# Patient Record
Sex: Male | Born: 1995
Health system: Southern US, Community
[De-identification: ages and names within clinical notes are randomized; demographics above are authoritative.]

## PROBLEM LIST (undated history)

## (undated) DIAGNOSIS — I10 Essential (primary) hypertension: Secondary | ICD-10-CM

---
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
## 2014-01-18 ENCOUNTER — Emergency Department (HOSPITAL_COMMUNITY): Payer: Self-pay

## 2014-01-18 ENCOUNTER — Encounter (HOSPITAL_COMMUNITY): Payer: Self-pay | Admitting: Emergency Medicine

## 2014-01-18 ENCOUNTER — Emergency Department (HOSPITAL_COMMUNITY)
Admission: EM | Admit: 2014-01-18 | Discharge: 2014-01-18 | Disposition: A | Discharge status: Home / Self Care | Payer: Self-pay | Attending: Emergency Medicine | Admitting: Emergency Medicine

## 2014-01-18 DIAGNOSIS — S20211A Contusion of right front wall of thorax, initial encounter: Secondary | ICD-10-CM

## 2014-01-18 DIAGNOSIS — S0003XA Contusion of scalp, initial encounter: Secondary | ICD-10-CM | POA: Insufficient documentation

## 2014-01-18 DIAGNOSIS — Z880 Allergy status to penicillin: Secondary | ICD-10-CM | POA: Insufficient documentation

## 2014-01-18 DIAGNOSIS — Y92410 Unspecified street and highway as the place of occurrence of the external cause: Secondary | ICD-10-CM | POA: Insufficient documentation

## 2014-01-18 DIAGNOSIS — S0990XA Unspecified injury of head, initial encounter: Secondary | ICD-10-CM | POA: Insufficient documentation

## 2014-01-18 DIAGNOSIS — S20219A Contusion of unspecified front wall of thorax, initial encounter: Secondary | ICD-10-CM | POA: Insufficient documentation

## 2014-01-18 DIAGNOSIS — T148XXA Other injury of unspecified body region, initial encounter: Secondary | ICD-10-CM

## 2014-01-18 DIAGNOSIS — S1093XA Contusion of unspecified part of neck, initial encounter: Principal | ICD-10-CM

## 2014-01-18 DIAGNOSIS — S0093XA Contusion of unspecified part of head, initial encounter: Secondary | ICD-10-CM

## 2014-01-18 DIAGNOSIS — S0083XA Contusion of other part of head, initial encounter: Principal | ICD-10-CM | POA: Insufficient documentation

## 2014-01-18 DIAGNOSIS — Y9389 Activity, other specified: Secondary | ICD-10-CM | POA: Insufficient documentation

## 2014-01-18 DIAGNOSIS — S40012A Contusion of left shoulder, initial encounter: Secondary | ICD-10-CM

## 2014-01-18 MED ORDER — METHOCARBAMOL 500 MG PO TABS
1000.0000 mg | ORAL_TABLET | Freq: Once | ORAL | Status: AC
  Administered 2014-01-18: 1000 mg via ORAL
  Filled 2014-01-18: qty 2

## 2014-01-18 MED ORDER — NAPROXEN 375 MG PO TABS: 375.0000 mg | ORAL_TABLET | Freq: Two times a day (BID) | ORAL | Status: DC

## 2014-01-18 MED ORDER — CYCLOBENZAPRINE HCL 5 MG PO TABS: 5.0000 mg | ORAL_TABLET | Freq: Three times a day (TID) | ORAL | Status: DC | PRN

## 2014-01-18 MED ORDER — IBUPROFEN 800 MG PO TABS
800.0000 mg | ORAL_TABLET | Freq: Once | ORAL | Status: AC
  Administered 2014-01-18: 800 mg via ORAL
  Filled 2014-01-18: qty 1
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
## 2014-01-18 NOTE — Discharge Instructions (Signed)
Ice packs to the injured or sore muscles and use heat to help the muscles relax.  Take the medications for pain and muscle soreness. Return to the ED for any problems listed on the head injury sheet. Recheck if you aren't improving in the next week.

## 2014-01-18 NOTE — ED Notes (Signed)
Back board removed with charge nurse.

## 2017-06-04 ENCOUNTER — Encounter (HOSPITAL_COMMUNITY): Payer: Self-pay | Admitting: *Deleted

## 2017-06-04 ENCOUNTER — Ambulatory Visit (HOSPITAL_COMMUNITY): Admission: RE | Admit: 2017-06-04 | Payer: Self-pay | Source: Ambulatory Visit

## 2017-06-04 ENCOUNTER — Emergency Department (HOSPITAL_COMMUNITY)
Admission: EM | Admit: 2017-06-04 | Discharge: 2017-06-04 | Discharge status: Against Medical Advice | Payer: Self-pay | Attending: Emergency Medicine | Admitting: Emergency Medicine

## 2017-06-04 ENCOUNTER — Other Ambulatory Visit: Payer: Self-pay

## 2017-06-04 ENCOUNTER — Ambulatory Visit (HOSPITAL_COMMUNITY): Payer: Self-pay

## 2017-06-04 ENCOUNTER — Emergency Department (HOSPITAL_COMMUNITY): Admission: RE | Admit: 2017-06-04 | Payer: Self-pay | Source: Ambulatory Visit

## 2017-06-04 DIAGNOSIS — M25511 Pain in right shoulder: Secondary | ICD-10-CM | POA: Insufficient documentation

## 2017-06-04 DIAGNOSIS — Z5321 Procedure and treatment not carried out due to patient leaving prior to being seen by health care provider: Secondary | ICD-10-CM | POA: Insufficient documentation

## 2017-06-04 NOTE — ED Provider Notes (Cosign Needed)
Patient is a 22 year old male who presents to the emergency department because of right shoulder pain for a week.  No recent injury reported.  Blood pressure is slightly elevated at 126/96, otherwise vital signs are within normal limits.  Before the patient was examined, with his primary physician who says that they can see him in the office now.  The patient is leaving the emergency department to go see the primary physician.  Diagnosis:  right shoulder pain.  Plan.  Patient will see his primary physician now in the office.   Ivery QualeBryant, Joas Motton, PA-C 06/04/17 1601

## 2017-06-04 NOTE — ED Notes (Signed)
Pt had contacted his doctor and doctor able to see pt today.  Pt left with steady gait.

## 2017-06-04 NOTE — ED Triage Notes (Signed)
Right shoulder pain x 1 week

## 2019-01-03 DIAGNOSIS — Z23 Encounter for immunization: Secondary | ICD-10-CM | POA: Diagnosis not present

## 2019-01-03 DIAGNOSIS — R51 Headache: Secondary | ICD-10-CM | POA: Diagnosis not present

## 2019-01-03 DIAGNOSIS — I10 Essential (primary) hypertension: Secondary | ICD-10-CM | POA: Diagnosis not present

## 2019-01-03 DIAGNOSIS — E6609 Other obesity due to excess calories: Secondary | ICD-10-CM | POA: Diagnosis not present

## 2019-01-13 DIAGNOSIS — Z0000 Encounter for general adult medical examination without abnormal findings: Secondary | ICD-10-CM | POA: Diagnosis not present

## 2019-01-13 DIAGNOSIS — Z1329 Encounter for screening for other suspected endocrine disorder: Secondary | ICD-10-CM | POA: Diagnosis not present

## 2019-01-20 DIAGNOSIS — E782 Mixed hyperlipidemia: Secondary | ICD-10-CM | POA: Diagnosis not present

## 2019-01-20 DIAGNOSIS — I10 Essential (primary) hypertension: Secondary | ICD-10-CM | POA: Diagnosis not present

## 2019-01-20 DIAGNOSIS — R945 Abnormal results of liver function studies: Secondary | ICD-10-CM | POA: Diagnosis not present

## 2019-01-20 DIAGNOSIS — Z0001 Encounter for general adult medical examination with abnormal findings: Secondary | ICD-10-CM | POA: Diagnosis not present

## 2019-02-09 DIAGNOSIS — I10 Essential (primary) hypertension: Secondary | ICD-10-CM | POA: Diagnosis not present

## 2019-02-09 DIAGNOSIS — G43009 Migraine without aura, not intractable, without status migrainosus: Secondary | ICD-10-CM | POA: Diagnosis not present

## 2019-02-09 DIAGNOSIS — R079 Chest pain, unspecified: Secondary | ICD-10-CM | POA: Diagnosis not present

## 2019-02-14 ENCOUNTER — Emergency Department (HOSPITAL_COMMUNITY): Payer: BC Managed Care – PPO

## 2019-02-14 ENCOUNTER — Encounter (HOSPITAL_COMMUNITY): Payer: Self-pay

## 2019-02-14 ENCOUNTER — Emergency Department (HOSPITAL_COMMUNITY)
Admission: EM | Admit: 2019-02-14 | Discharge: 2019-02-14 | Disposition: A | Discharge status: Home / Self Care | Payer: BC Managed Care – PPO | Attending: Emergency Medicine | Admitting: Emergency Medicine

## 2019-02-14 ENCOUNTER — Other Ambulatory Visit: Payer: Self-pay

## 2019-02-14 DIAGNOSIS — Z79899 Other long term (current) drug therapy: Secondary | ICD-10-CM | POA: Insufficient documentation

## 2019-02-14 DIAGNOSIS — R0789 Other chest pain: Secondary | ICD-10-CM | POA: Diagnosis not present

## 2019-02-14 DIAGNOSIS — I10 Essential (primary) hypertension: Secondary | ICD-10-CM | POA: Insufficient documentation

## 2019-02-14 DIAGNOSIS — R079 Chest pain, unspecified: Secondary | ICD-10-CM | POA: Diagnosis not present

## 2019-02-14 HISTORY — DX: Essential (primary) hypertension: I10

## 2019-02-14 LAB — BASIC METABOLIC PANEL
Anion gap: 8 (ref 5–15)
BUN: 20 mg/dL (ref 6–20)
CO2: 23 mmol/L (ref 22–32)
Calcium: 9 mg/dL (ref 8.9–10.3)
Chloride: 105 mmol/L (ref 98–111)
Creatinine, Ser: 0.89 mg/dL (ref 0.61–1.24)
GFR calc Af Amer: >60 mL/min (ref >60)
GFR calc non Af Amer: >60 mL/min (ref >60)
Glucose, Bld: 105 mg/dL — ABNORMAL HIGH (ref 70–99)
Potassium: 3.6 mmol/L (ref 3.5–5.1)
Sodium: 136 mmol/L (ref 135–145)

## 2019-02-14 LAB — CBC
HCT: 49.2 % (ref 39.0–52.0)
Hemoglobin: 16.5 g/dL (ref 13.0–17.0)
MCH: 29.4 pg (ref 26.0–34.0)
MCHC: 33.5 g/dL (ref 30.0–36.0)
MCV: 87.7 fL (ref 80.0–100.0)
Platelets: 281 10*3/uL (ref 150–400)
RBC: 5.61 MIL/uL (ref 4.22–5.81)
RDW: 12.1 % (ref 11.5–15.5)
WBC: 7.7 10*3/uL (ref 4.0–10.5)
nRBC: 0 % (ref 0.0–0.2)

## 2019-02-14 LAB — TROPONIN I (HIGH SENSITIVITY)
Troponin I (High Sensitivity): <2 ng/L (ref <18)
Troponin I (High Sensitivity): <2 ng/L (ref <18)

## 2019-02-14 LAB — RAPID URINE DRUG SCREEN, HOSP PERFORMED
Amphetamines: NOT DETECTED
Barbiturates: NOT DETECTED
Benzodiazepines: NOT DETECTED
Cocaine: NOT DETECTED
Opiates: NOT DETECTED
Tetrahydrocannabinol: NOT DETECTED

## 2019-02-14 MED ORDER — ASPIRIN 81 MG PO CHEW
324.0000 mg | CHEWABLE_TABLET | Freq: Once | ORAL | Status: AC
  Administered 2019-02-14: 324 mg via ORAL
  Filled 2019-02-14: qty 4

## 2019-02-14 MED ORDER — NITROGLYCERIN 0.4 MG SL SUBL
0.4000 mg | SUBLINGUAL_TABLET | Freq: Once | SUBLINGUAL | Status: AC
  Administered 2019-02-14: 0.4 mg via SUBLINGUAL
  Filled 2019-02-14: qty 1

## 2019-02-14 NOTE — Discharge Instructions (Signed)
Your work-up today was reassuring.  Continue taking your medications as prescribed.  Avoid salty foods.  I have attached information for an eating plan related to your high blood pressure as well as information about physical activity.  Follow-up with your primary care provider or cardiologist for reevaluation of symptoms. Return to the emergency department if any concerning signs or symptoms develop such as worsening chest pains, shortness of breath, loss of consciousness, persistent vomiting, or high fevers.

## 2019-02-14 NOTE — ED Notes (Signed)
Patient discharge and escorted to car by security.
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
## 2019-02-14 NOTE — ED Triage Notes (Signed)
Pt reports left sided chest pain and tingling in both arms x 69min.  Reports has htn and recently stopped hctz and started propranolol.

## 2019-02-17 ENCOUNTER — Encounter: Payer: Self-pay | Admitting: Cardiovascular Disease

## 2019-02-17 ENCOUNTER — Telehealth: Payer: Self-pay | Admitting: Cardiovascular Disease

## 2019-02-17 ENCOUNTER — Other Ambulatory Visit: Payer: Self-pay

## 2019-02-17 ENCOUNTER — Ambulatory Visit: Payer: BC Managed Care – PPO | Admitting: Cardiovascular Disease

## 2019-02-17 VITALS — BP 128/82 | HR 57 | Ht 66.0 in | Wt 220.0 lb

## 2019-02-17 DIAGNOSIS — F419 Anxiety disorder, unspecified: Secondary | ICD-10-CM

## 2019-02-17 DIAGNOSIS — I10 Essential (primary) hypertension: Secondary | ICD-10-CM | POA: Diagnosis not present

## 2019-02-17 DIAGNOSIS — R079 Chest pain, unspecified: Secondary | ICD-10-CM

## 2019-02-17 NOTE — Telephone Encounter (Signed)
°  Precert needed for: Echo   Location: [facility]    Date: Feb 22, 2019

## 2019-02-17 NOTE — Progress Notes (Signed)
CARDIOLOGY CONSULT NOTE  Patient ID: Bradley King MRN: [id number] DOB/AGE: 08/31/1995 23 y.o.  Admit date: (Not on file) Primary Physician: Celene Squibb, MD  Reason for Consultation: Chest pain  HPI: Bradley King is a 23 y.o. male who is being seen today for the evaluation of chest pain at the request of Celene Squibb, MD.   He has a history of hypertension.  He was evaluated in the ED for chest pain on 02/14/2019. I have personally reviewed all documentation, labs, radiographic and cardiovascular studies, and independently interpreted all ECG's.  It was felt it may have been musculoskeletal in etiology.  Troponins, CBC, and basic metabolic panel were all unremarkable.  Chest x-ray was normal.  I personally reviewed the ECG which demonstrated sinus rhythm and left axis deviation.  He said he does not eat a healthy diet and eats a lot of fast food.  He also deals with a lot of anxiety and stress as he has a very stressful job.  He wants to begin dieting and exercising and walking 2 miles a day.  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  He did not feel stressed at that time.   Allergies  Allergen Reactions  . Penicillins Hives and Rash    Current Outpatient Medications  Medication Sig Dispense Refill  . aspirin 325 MG EC tablet Take 325 mg by mouth every 6 (six) hours as needed for pain.    Marland Kitchen propranolol (INDERAL) 10 MG tablet Take 10 mg by mouth 3 (three) times daily.     No current facility-administered medications for this visit.     Past Medical History:  Diagnosis Date  . Hypertension     No past surgical history on file.  Social History   Socioeconomic History  . Marital status: Single    Spouse name: Not on file  . Number of children: Not on file  . Years of education: Not on file  . Highest education level: Not on file  Occupational History   . Not on file  Social Needs  . Financial resource strain: Not on file  . Food insecurity    Worry: Not on file    Inability: Not on file  . Transportation needs    Medical: Not on file    Non-medical: Not on file  Tobacco Use  . Smoking status: Never Smoker  . Smokeless tobacco: Never Used  Substance and Sexual Activity  . Alcohol use: No  . Drug use: No  . Sexual activity: Not on file  Lifestyle  . Physical activity    Days per week: Not on file    Minutes per session: Not on file  . Stress: Not on file  Relationships  . Social Herbalist on phone: Not on file    Gets together: Not on file    Attends religious service: Not on file    Active member of club or organization: Not on file    Attends meetings of clubs or organizations: Not on file    Relationship status: Not on file  . Intimate partner violence    Fear of current or ex partner: Not on file    Emotionally abused: Not on file    Physically abused: Not on file    Forced sexual activity: Not on file  Other Topics Concern  . Not on file  Social History Narrative  . Not on file     No family history of premature CAD in 1st degree relatives.  Current Meds  Medication Sig  . aspirin 325 MG EC tablet Take 325 mg by mouth every 6 (six) hours as needed for pain.  Marland Kitchen propranolol (INDERAL) 10 MG tablet Take 10 mg by mouth 3 (three) times daily.      Review of systems complete and found to be negative unless listed above in HPI    Physical exam Blood pressure 128/82, pulse (!) 57, height 5\' 6"  (1.676 m), weight 220 lb (99.8 kg), SpO2 98 %. General: NAD Neck: No JVD, no thyromegaly or thyroid nodule.  Lungs: Clear to auscultation bilaterally with normal respiratory effort. CV: Nondisplaced PMI. Regular rate and rhythm, normal S1/S2, no S3/S4, no murmur.  No peripheral edema.  No carotid bruit.    Abdomen: Soft, nontender, no distention.  Skin: Intact without lesions or rashes.  Neurologic: Alert  and oriented x 3.  Psych: Normal affect. Extremities: No clubbing or cyanosis.  HEENT: Normal.   ECG: Most recent ECG reviewed.   Labs: Lab Results  Component Value Date/Time   K 3.6 02/14/2019 05:31 PM   BUN 20 02/14/2019 05:31 PM   CREATININE 0.89 02/14/2019 05:31 PM   HGB 16.5 02/14/2019 05:31 PM     Lipids: No results found for: LDLCALC, LDLDIRECT, CHOL, TRIG, HDL      ASSESSMENT AND PLAN:  1.  Chest pain: Given his age and lack of significant risk factors I doubt chest pain is cardiac in etiology.  He does not describe experiencing stress prior to the episode of chest pain, although he says he has a high level of anxiety and stress at baseline.  I will order a 2-D echocardiogram with Doppler to evaluate cardiac structure, function, and regional wall motion.  2.  Hypertension: Blood pressure is normal on propranolol.  No changes to therapy.  3.  Anxiety and stress: He is now on propranolol.    Disposition: Follow up to be determined  Signed: 02/16/2019, M.D., F.A.C.C.  02/17/2019, 10:46 AM

## 2019-02-17 NOTE — Patient Instructions (Signed)
Medication Instructions:  Continue all current medications.  Labwork: none  Testing/Procedures:  Your physician has requested that you have an echocardiogram. Echocardiography is a painless test that uses sound waves to create images of your heart. It provides your doctor with information about the size and shape of your heart and how well your heart's chambers and valves are working. This procedure takes approximately one hour. There are no restrictions for this procedure.  Office will contact with results via phone or letter.    Follow-Up: To be determined - pending test results.   Any Other Special Instructions Will Be Listed Below (If Applicable).  If you need a refill on your cardiac medications before your next appointment, please call your pharmacy.

## 2019-02-20 ENCOUNTER — Ambulatory Visit: Payer: Self-pay | Admitting: Cardiovascular Disease

## 2019-02-22 ENCOUNTER — Other Ambulatory Visit: Payer: Self-pay

## 2019-02-22 ENCOUNTER — Ambulatory Visit (HOSPITAL_COMMUNITY)
Admission: RE | Admit: 2019-02-22 | Discharge: 2019-02-22 | Disposition: A | Discharge status: Home / Self Care | Payer: BC Managed Care – PPO | Source: Ambulatory Visit | Attending: Cardiovascular Disease | Admitting: Cardiovascular Disease

## 2019-02-22 DIAGNOSIS — R079 Chest pain, unspecified: Secondary | ICD-10-CM | POA: Diagnosis not present

## 2019-02-22 NOTE — Progress Notes (Signed)
*  PRELIMINARY RESULTS* Echocardiogram 2D Echocardiogram has been performed.  Bradley King 02/22/2019, 9:32 AM

## 2019-03-02 ENCOUNTER — Telehealth: Payer: Self-pay | Admitting: *Deleted

## 2019-03-02 NOTE — Telephone Encounter (Signed)
Notes recorded by Laurine Blazer, LPN on 91/09/9448 at 38:88 PM EST  Patient notified. Copy to pmd. Follow up scheduled for 05/15/2019 with Dr. Bronson Ing.  ------   Notes recorded by Laurine Blazer, LPN on 28/0/0349 at 17:91 AM EST  Left message to return call.   ------   Notes recorded by Laurine Blazer, LPN on 50/56/9794 at 10:44 AM EDT  Left message to return call.   ------   Notes recorded by Herminio Commons, MD on 02/22/2019 at 10:22 AM EDT  Normal pumping function. There is mitral valve leakage will will need continued monitoring.

## 2019-03-02 NOTE — Telephone Encounter (Signed)
The patient verbally consented for a telehealth phone visit with [facility] and understands that his/her insurance company will be billed for the encounter.  Will have vitals & medications.  3 mo f/u scheduled for 05/15/2019 with Dr. Bronson Ing.

## 2019-05-08 ENCOUNTER — Encounter: Payer: Self-pay | Admitting: Cardiovascular Disease

## 2019-05-08 ENCOUNTER — Telehealth: Payer: BC Managed Care – PPO | Admitting: Cardiovascular Disease

## 2019-05-15 ENCOUNTER — Telehealth: Payer: BC Managed Care – PPO | Admitting: Cardiovascular Disease

## 2019-05-16 ENCOUNTER — Telehealth: Payer: Self-pay | Admitting: Cardiovascular Disease

## 2019-05-16 NOTE — Telephone Encounter (Signed)
Forward to provider for notification.  

## 2019-05-16 NOTE — Telephone Encounter (Signed)
Patient called and wanted Dr Purvis Sheffield to know that he has lost his insurance and that is why he has had to cancel his appointments. That he really liked out practice and did not want to be told he couldn't come back due to not being able to keep his appointment .  I mailed him paperwork to apply for [facility] and explained if he gets approved it would help until he get new insurance.

## 2019-05-22 NOTE — Telephone Encounter (Signed)
Noted  

## 2019-12-05 ENCOUNTER — Encounter (HOSPITAL_COMMUNITY): Payer: Self-pay | Admitting: Emergency Medicine

## 2019-12-05 ENCOUNTER — Other Ambulatory Visit: Payer: Self-pay

## 2019-12-05 ENCOUNTER — Emergency Department (HOSPITAL_COMMUNITY)
Admission: EM | Admit: 2019-12-05 | Discharge: 2019-12-05 | Disposition: A | Discharge status: Home / Self Care | Payer: No Typology Code available for payment source | Attending: Emergency Medicine | Admitting: Emergency Medicine

## 2019-12-05 ENCOUNTER — Emergency Department (HOSPITAL_COMMUNITY): Payer: No Typology Code available for payment source

## 2019-12-05 DIAGNOSIS — Y92410 Unspecified street and highway as the place of occurrence of the external cause: Secondary | ICD-10-CM | POA: Diagnosis not present

## 2019-12-05 DIAGNOSIS — Z7982 Long term (current) use of aspirin: Secondary | ICD-10-CM | POA: Diagnosis not present

## 2019-12-05 DIAGNOSIS — Z79899 Other long term (current) drug therapy: Secondary | ICD-10-CM | POA: Insufficient documentation

## 2019-12-05 DIAGNOSIS — Y999 Unspecified external cause status: Secondary | ICD-10-CM | POA: Insufficient documentation

## 2019-12-05 DIAGNOSIS — Y939 Activity, unspecified: Secondary | ICD-10-CM | POA: Diagnosis not present

## 2019-12-05 DIAGNOSIS — I10 Essential (primary) hypertension: Secondary | ICD-10-CM | POA: Diagnosis not present

## 2019-12-05 DIAGNOSIS — W309XXA Contact with unspecified agricultural machinery, initial encounter: Secondary | ICD-10-CM

## 2019-12-05 DIAGNOSIS — M545 Low back pain, unspecified: Secondary | ICD-10-CM

## 2019-12-05 MED ORDER — NAPROXEN 250 MG PO TABS
500.0000 mg | ORAL_TABLET | Freq: Once | ORAL | Status: AC
  Administered 2019-12-05: 500 mg via ORAL
  Filled 2019-12-05: qty 2

## 2019-12-05 MED ORDER — METHOCARBAMOL 500 MG PO TABS: 500.0000 mg | ORAL_TABLET | Freq: Two times a day (BID) | ORAL | 0 refills | Status: AC

## 2019-12-05 MED ORDER — NAPROXEN 500 MG PO TABS: 500.0000 mg | ORAL_TABLET | Freq: Two times a day (BID) | ORAL | 0 refills | Status: AC
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
## 2019-12-05 NOTE — Discharge Instructions (Addendum)
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
## 2020-02-09 ENCOUNTER — Other Ambulatory Visit: Payer: Self-pay

## 2020-02-09 ENCOUNTER — Emergency Department (HOSPITAL_COMMUNITY)
Admission: EM | Admit: 2020-02-09 | Discharge: 2020-02-09 | Disposition: A | Discharge status: Home / Self Care | Payer: Self-pay | Attending: Emergency Medicine | Admitting: Emergency Medicine

## 2020-02-09 ENCOUNTER — Encounter (HOSPITAL_COMMUNITY): Payer: Self-pay | Admitting: Emergency Medicine

## 2020-02-09 ENCOUNTER — Emergency Department (HOSPITAL_COMMUNITY): Payer: Self-pay

## 2020-02-09 DIAGNOSIS — N200 Calculus of kidney: Secondary | ICD-10-CM

## 2020-02-09 DIAGNOSIS — I10 Essential (primary) hypertension: Secondary | ICD-10-CM | POA: Insufficient documentation

## 2020-02-09 LAB — COMPREHENSIVE METABOLIC PANEL
ALT: 73 U/L — ABNORMAL HIGH (ref 0–44)
AST: 31 U/L (ref 15–41)
Albumin: 4.7 g/dL (ref 3.5–5.0)
Alkaline Phosphatase: 92 U/L (ref 38–126)
Anion gap: 12 (ref 5–15)
BUN: 21 mg/dL — ABNORMAL HIGH (ref 6–20)
CO2: 24 mmol/L (ref 22–32)
Calcium: 9.9 mg/dL (ref 8.9–10.3)
Chloride: 102 mmol/L (ref 98–111)
Creatinine, Ser: 0.95 mg/dL (ref 0.61–1.24)
GFR, Estimated: >60 mL/min (ref >60)
Glucose, Bld: 101 mg/dL — ABNORMAL HIGH (ref 70–99)
Potassium: 4.3 mmol/L (ref 3.5–5.1)
Sodium: 138 mmol/L (ref 135–145)
Total Bilirubin: 0.5 mg/dL (ref 0.3–1.2)
Total Protein: 8.2 g/dL — ABNORMAL HIGH (ref 6.5–8.1)

## 2020-02-09 LAB — CBC WITH DIFFERENTIAL/PLATELET
Abs Immature Granulocytes: 0.02 10*3/uL (ref 0.00–0.07)
Basophils Absolute: 0.1 10*3/uL (ref 0.0–0.1)
Basophils Relative: 1 %
Eosinophils Absolute: 0.2 10*3/uL (ref 0.0–0.5)
Eosinophils Relative: 3 %
HCT: 46.8 % (ref 39.0–52.0)
Hemoglobin: 16.1 g/dL (ref 13.0–17.0)
Immature Granulocytes: 0 %
Lymphocytes Relative: 26 %
Lymphs Abs: 2 10*3/uL (ref 0.7–4.0)
MCH: 30 pg (ref 26.0–34.0)
MCHC: 34.4 g/dL (ref 30.0–36.0)
MCV: 87.3 fL (ref 80.0–100.0)
Monocytes Absolute: 0.9 10*3/uL (ref 0.1–1.0)
Monocytes Relative: 12 %
Neutro Abs: 4.4 10*3/uL (ref 1.7–7.7)
Neutrophils Relative %: 58 %
Platelets: 272 10*3/uL (ref 150–400)
RBC: 5.36 MIL/uL (ref 4.22–5.81)
RDW: 12.2 % (ref 11.5–15.5)
WBC: 7.6 10*3/uL (ref 4.0–10.5)
nRBC: 0 % (ref 0.0–0.2)

## 2020-02-09 LAB — URINALYSIS, ROUTINE W REFLEX MICROSCOPIC
Bacteria, UA: NONE SEEN
Bilirubin Urine: NEGATIVE
Glucose, UA: NEGATIVE mg/dL
Ketones, ur: NEGATIVE mg/dL
Leukocytes,Ua: NEGATIVE
Nitrite: NEGATIVE
Protein, ur: 30 mg/dL — AB
RBC / HPF: >50 RBC/hpf — ABNORMAL HIGH (ref 0–5)
Specific Gravity, Urine: 1.021 (ref 1.005–1.030)
pH: 6 (ref 5.0–8.0)

## 2020-02-09 LAB — LIPASE, BLOOD: Lipase: 40 U/L (ref 11–51)

## 2020-02-09 MED ORDER — OXYCODONE-ACETAMINOPHEN 5-325 MG PO TABS: 1.0000 | ORAL_TABLET | Freq: Four times a day (QID) | ORAL | 0 refills | Status: AC | PRN

## 2020-02-09 MED ORDER — OXYCODONE-ACETAMINOPHEN 5-325 MG PO TABS: 1.0000 | ORAL_TABLET | Freq: Four times a day (QID) | ORAL | 0 refills | Status: DC | PRN

## 2020-02-09 MED ORDER — TAMSULOSIN HCL 0.4 MG PO CAPS: 0.4000 mg | ORAL_CAPSULE | Freq: Every day | ORAL | 0 refills | Status: AC

## 2020-02-09 MED ORDER — KETOROLAC TROMETHAMINE 30 MG/ML IJ SOLN
30.0000 mg | Freq: Once | INTRAMUSCULAR | Status: AC
  Administered 2020-02-09: 30 mg via INTRAVENOUS
  Filled 2020-02-09: qty 1

## 2020-02-09 MED ORDER — SENNOSIDES-DOCUSATE SODIUM 8.6-50 MG PO TABS: 1.0000 | ORAL_TABLET | Freq: Every evening | ORAL | 0 refills | Status: AC | PRN

## 2020-02-09 MED ORDER — IBUPROFEN 800 MG PO TABS: 800.0000 mg | ORAL_TABLET | Freq: Three times a day (TID) | ORAL | 0 refills | Status: AC | PRN

## 2020-02-09 NOTE — ED Provider Notes (Signed)
Emergency Department Provider Note   I have reviewed the triage vital signs and the nursing notes.   HISTORY  Chief Complaint Flank Pain   HPI Bradley King is a 24 y.o. male with PMH of HTN presents to the ED with return of right flank pain this AM now with hematuria. Patient states that several months ago he had an episode of right flank pain while out drinking. He had no hematuria at that time. He had intermittent discomfort that was moderate to severe but pain symptoms resolved. He has not had any discomfort until this morning when he had return of his flank pain but now notices blood in the urine which is new. He is not experiencing fevers or chills. Pain radiates from the right flank anteriorly. He has no prior history of kidney stone. No chest pain or shortness of breath. No dysuria.   Past Medical History:  Diagnosis Date  . Hypertension     There are no problems to display for this patient.   History reviewed. No pertinent surgical history.  Allergies Penicillins  Family History  Problem Relation Age of Onset  . Heart attack Maternal Grandfather     Social History Social History   Tobacco Use  . Smoking status: Never Smoker  . Smokeless tobacco: Never Used  Vaping Use  . Vaping Use: Never used  Substance Use Topics  . Alcohol use: Yes    Comment: occ  . Drug use: No    Review of Systems  Constitutional: No fever/chills Eyes: No visual changes. ENT: No sore throat. Cardiovascular: Denies chest pain. Respiratory: Denies shortness of breath. Gastrointestinal: Positive right flank/abdominal pain. Positive nausea, no vomiting.  No diarrhea.  No constipation. Genitourinary: Negative for dysuria. Positive hematuria.  Musculoskeletal: Negative for back pain. Skin: Negative for rash. Neurological: Negative for headaches, focal weakness or numbness.  10-point ROS otherwise negative.  ____________________________________________   PHYSICAL  EXAM:  VITAL SIGNS: ED Triage Vitals  Enc Vitals Group     BP 02/09/20 0745 (!) 175/92     Pulse Rate 02/09/20 0745 87     Resp 02/09/20 0745 18     Temp 02/09/20 0745 98.7 F (37.1 C)     Temp Source 02/09/20 0745 Oral     SpO2 02/09/20 0745 98 %     Weight 02/09/20 0746 245 lb (111.1 kg)     Height 02/09/20 0746 5\' 5"  (1.651 m)   Constitutional: Alert and oriented. Well appearing but standing and shifting back and fourth at bedside.  Eyes: Conjunctivae are normal.  Head: Atraumatic. Nose: No congestion/rhinnorhea. Mouth/Throat: Mucous membranes are moist. Neck: No stridor.   Cardiovascular: Normal rate, regular rhythm.  Respiratory: Normal respiratory effort.   Gastrointestinal: Soft and nontender. No distention. Positive right CVA tenderness.  Musculoskeletal:  No gross deformities of extremities. Neurologic:  Normal speech and language. Ambulatory without difficulty.  Skin:  Skin is warm, dry and intact. No rash noted.  ____________________________________________   LABS (all labs ordered are listed, but only abnormal results are displayed)  Labs Reviewed  URINALYSIS, ROUTINE W REFLEX MICROSCOPIC - Abnormal; Notable for the following components:      Result Value   APPearance HAZY (*)    Hgb urine dipstick LARGE (*)    Protein, ur 30 (*)    RBC / HPF >50 (*)    All other components within normal limits  COMPREHENSIVE METABOLIC PANEL - Abnormal; Notable for the following components:   Glucose, Bld 101 (*)  BUN 21 (*)    Total Protein 8.2 (*)    ALT 73 (*)    All other components within normal limits  URINE CULTURE  CBC WITH DIFFERENTIAL/PLATELET  LIPASE, BLOOD   ____________________________________________  RADIOLOGY  CT Renal Stone Study  Result Date: 02/09/2020 CLINICAL DATA:  Right flank pain. EXAM: CT ABDOMEN AND PELVIS WITHOUT CONTRAST TECHNIQUE: Multidetector CT imaging of the abdomen and pelvis was performed following the standard protocol without  IV contrast. COMPARISON:  None. FINDINGS: Lower chest: Unremarkable. Hepatobiliary: The liver shows diffusely decreased attenuation suggesting fat deposition. No focal abnormality in the liver on this study without intravenous contrast. There is no evidence for gallstones, gallbladder wall thickening, or pericholecystic fluid. Pancreas: No focal mass lesion. No dilatation of the main duct. No intraparenchymal cyst. No peripancreatic edema. Spleen: No splenomegaly. No focal mass lesion. Adrenals/Urinary Tract: No adrenal nodule or mass. No right renal stones although there is mild fullness of the right intrarenal collecting system. 1 x 1 x 2 mm stone is identified at the right UPJ. No right ureteral stone. 1-2 mm nonobstructing stone noted interpolar left kidney, best seen on coronal image 67/series 5. No left ureteral stone. No secondary changes in the left kidney or ureter. Bladder is decompressed without evidence of stone disease. Stomach/Bowel: Stomach is unremarkable. No gastric wall thickening. No evidence of outlet obstruction. Duodenum is normally positioned as is the ligament of Treitz. Tiny duodenal diverticulum evident. No small bowel wall thickening. No small bowel dilatation. The terminal ileum is normal. The appendix is normal. No gross colonic mass. No colonic wall thickening. Age advanced diverticular disease noted sigmoid colon without diverticulitis. Vascular/Lymphatic: No abdominal aortic aneurysm. No abdominal aortic atherosclerotic calcification. There is no gastrohepatic or hepatoduodenal ligament lymphadenopathy. No retroperitoneal or mesenteric lymphadenopathy. No pelvic sidewall lymphadenopathy. Reproductive: The prostate gland and seminal vesicles are unremarkable. Other: No intraperitoneal free fluid. Musculoskeletal: No worrisome lytic or sclerotic osseous abnormality. IMPRESSION: 1. 1 x 1 x 2 mm right UPJ stone with mild fullness of the right intrarenal collecting system. No other stones  in the right renal collecting system. 2. 1-2 mm nonobstructing stone interpolar left kidney. 3. Hepatic steatosis. Electronically Signed   By: Kennith Center M.D.   On: 02/09/2020 09:04    ____________________________________________   PROCEDURES  Procedure(s) performed:   Procedures  None  ____________________________________________   INITIAL IMPRESSION / ASSESSMENT AND PLAN / ED COURSE  Pertinent labs & imaging results that were available during my care of the patient were reviewed by me and considered in my medical decision making (see chart for details).   Patient presents to the emergency department with right flank pain and hematuria. Clinically I suspect kidney stone with lower suspicion for diverticulitis, appendicitis, gallbladder pathology. Plan for CT renal protocol with labs and UA. Will send urine for culture but patient without infection symptoms at this time.  CT with 1 x 2 mm ureteral stone with hydro at the UPJ. This correlates well with the patient's presentation. Pain is well controlled here. No UTI. Plan for pain mgmt at home and Urology follow up. Discussed ED return precautions.  ____________________________________________  FINAL CLINICAL IMPRESSION(S) / ED DIAGNOSES  Final diagnoses:  Kidney stone     MEDICATIONS GIVEN DURING THIS VISIT:  Medications  ketorolac (TORADOL) 30 MG/ML injection 30 mg (30 mg Intravenous Given 02/09/20 0935)     NEW OUTPATIENT MEDICATIONS STARTED DURING THIS VISIT:  Discharge Medication List as of 02/09/2020  9:46 AM  START taking these medications   Details  ibuprofen (ADVIL) 800 MG tablet Take 1 tablet (800 mg total) by mouth every 8 (eight) hours as needed., Starting Fri 02/09/2020, Normal    senna-docusate (SENOKOT-S) 8.6-50 MG tablet Take 1 tablet by mouth at bedtime as needed for mild constipation or moderate constipation., Starting Fri 02/09/2020, Normal    tamsulosin (FLOMAX) 0.4 MG CAPS capsule Take 1  capsule (0.4 mg total) by mouth daily for 7 days., Starting Fri 02/09/2020, Until Fri 02/16/2020, Normal    oxyCODONE-acetaminophen (PERCOCET/ROXICET) 5-325 MG tablet Take 1 tablet by mouth every 6 (six) hours as needed for severe pain., Starting Fri 02/09/2020, Normal        Note:  This document was prepared using Dragon voice recognition software and may include unintentional dictation errors.  Alona Bene, MD, [facility] Emergency Medicine    Ihsan Nomura, Arlyss Repress, MD 02/12/20 1515

## 2020-02-09 NOTE — Clinical Social Work Note (Signed)
Transition of Care [facility]) - Emergency Department Mini Assessment  Patient Details  Name: Bradley King MRN: [id number] Date of Birth: 1995/11/16  [facility] CM/SW Contact:    Ewing Schlein, LCSW Phone Number: 02/09/2020, 9:35 AM  Clinical Narrative: Patient is a 24 year old male who presented to the ED for back pain and cloudy urine. Per chart review, patient does not have insurance. CSW spoke with patient regarding referral to financial counselor. Patient reported he usually prefers to pay his medical bills out of pocket and declined referral to financial counselor at this time. TOC signing off.  ED Mini Assessment: What brought you to the Emergency Department? : Back pain, cloudy urine Barriers to Discharge: Inadequate or no insurance Barrier interventions: Discussed financial counselor referral Means of departure: Car Interventions which prevented an admission or readmission: Other (must enter comment) (Discussed financial counselor referral)  Admission diagnosis:  CLOUDY URINE,BACK PAIN There are no problems to display for this patient.  PCP:  Benita Stabile, MD Pharmacy:   [facility] - [place], [place] - [address] [place] [address] Phone: [phone] Fax: [phone]

## 2020-02-09 NOTE — ED Triage Notes (Signed)
Pt reports right flank pain x several months. Pt reports nausea and urinary frequency.

## 2020-02-09 NOTE — Discharge Instructions (Signed)
You were seen in the emerge department today with right flank pain and found to have a kidney stone on that side.  I have called in several medications to help with your symptoms.  The Percocet is very strong and you should not take it while driving.  You can also not take it with other pain, anxiety medications or alcohol.  Please follow with your primary care doctor and I have also listed the name of a urologist for follow-up.  If develop worsening pain, fever, chills, body aches you should return to the emergency department immediately for evaluation.

## 2020-02-09 NOTE — ED Notes (Signed)
Signature pad not working. Pt verbalized understanding. 

## 2020-02-10 LAB — URINE CULTURE: Culture: NO GROWTH

## 2020-09-01 IMAGING — CR DG CHEST 1V PORT
1 series · 1 of 1 positions shown · non-contrast
Comparison: January 18, 2014

CLINICAL DATA: Chest pain

EXAM:
PORTABLE CHEST 1 VIEW

[portable]
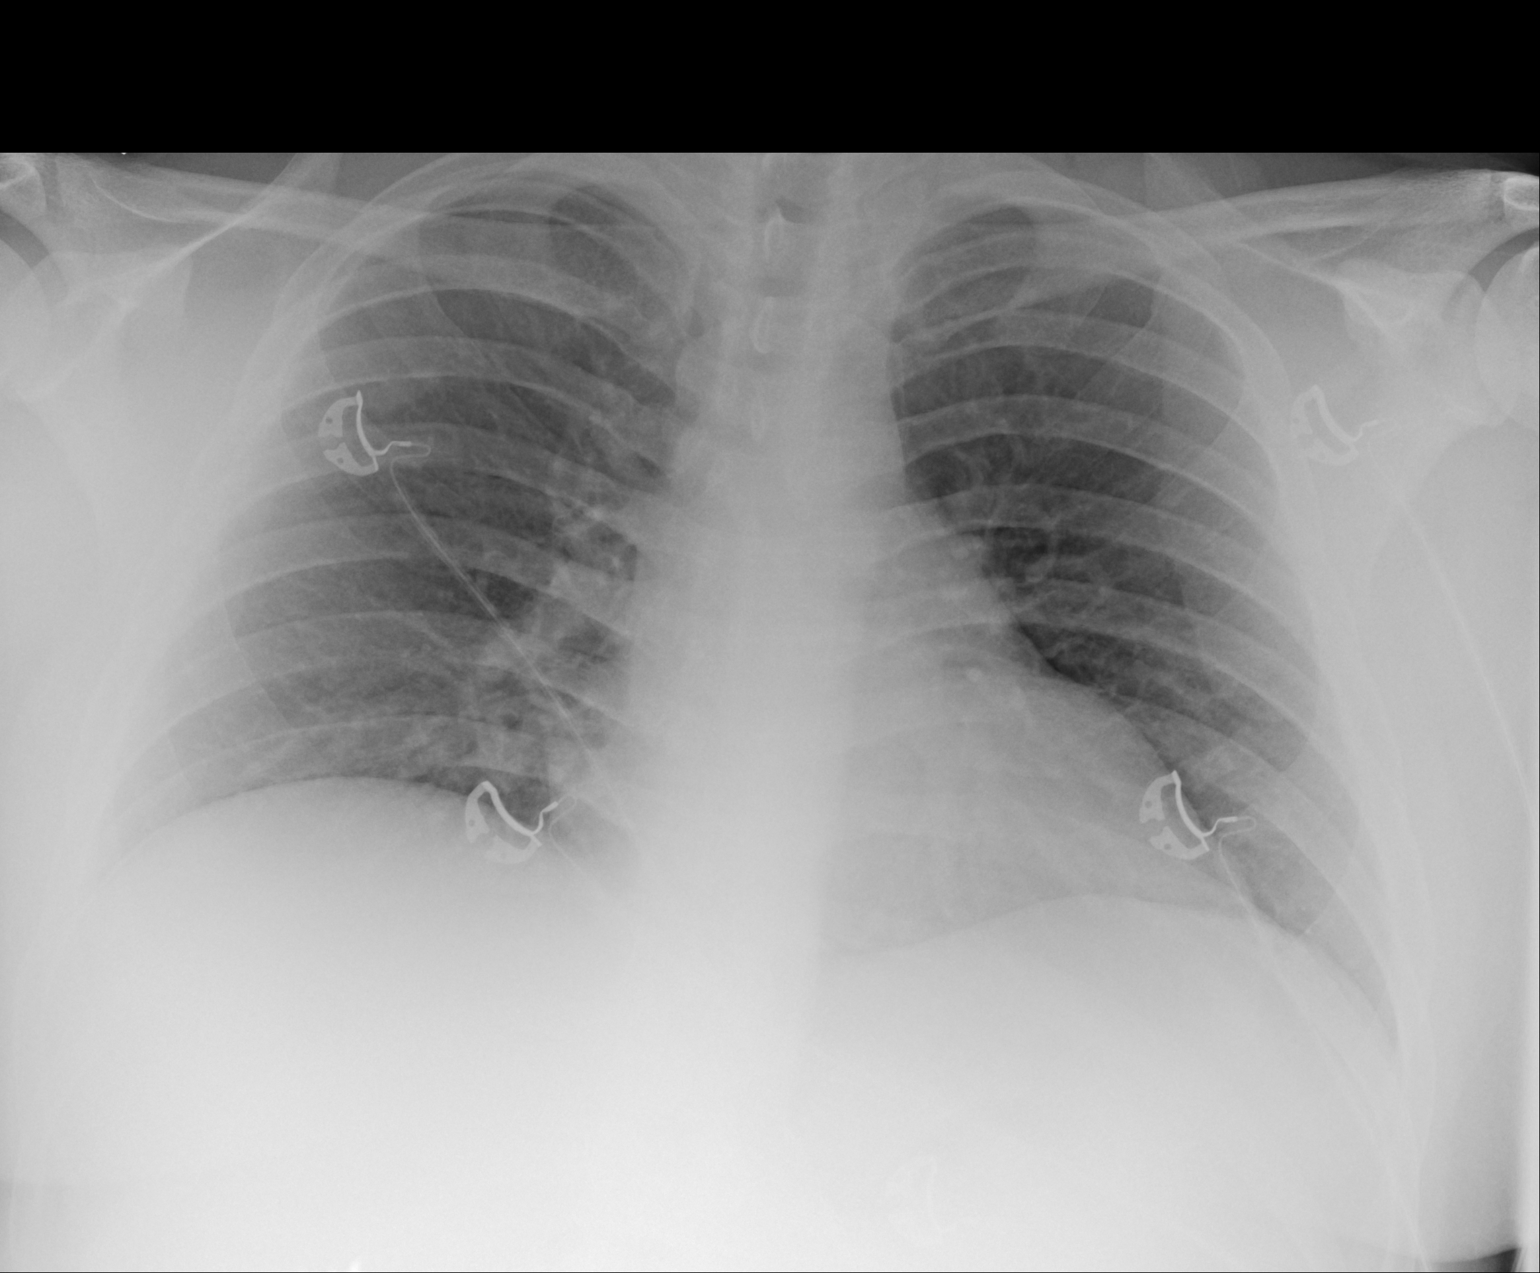

[1 of 1 positions shown; findings below may reference images not displayed]

FINDINGS: Lungs are clear. Heart is upper normal in size with pulmonary
vascularity normal. No adenopathy. No pneumothorax. No bone lesions.
IMPRESSION: No edema or consolidation.  Heart upper normal in size.

## 2021-08-27 IMAGING — CT CT RENAL STONE PROTOCOL
2 of 4 series · 16 of 46 positions shown, 18 images · non-contrast
Comparison: None.

CLINICAL DATA: Right flank pain.

EXAM:
CT ABDOMEN AND PELVIS WITHOUT CONTRAST
TECHNIQUE: Multidetector CT imaging of the abdomen and pelvis was performed
following the standard protocol without IV contrast.

[Series 2: axial st · axial · 0.93mm/px · z∈[+818,+1263]mm · 13 of 101 slices shown, 15 images]
[im 6/101  soft-tissue]
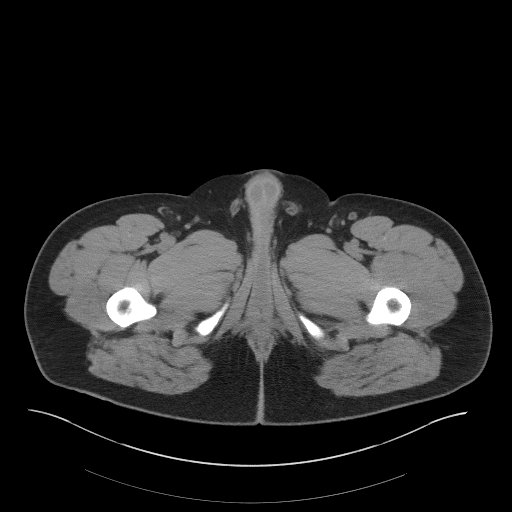
[im 6/101  bone]
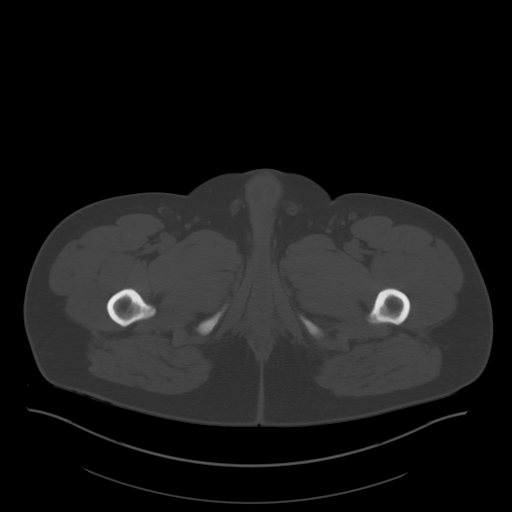
[im 16/101  soft-tissue]
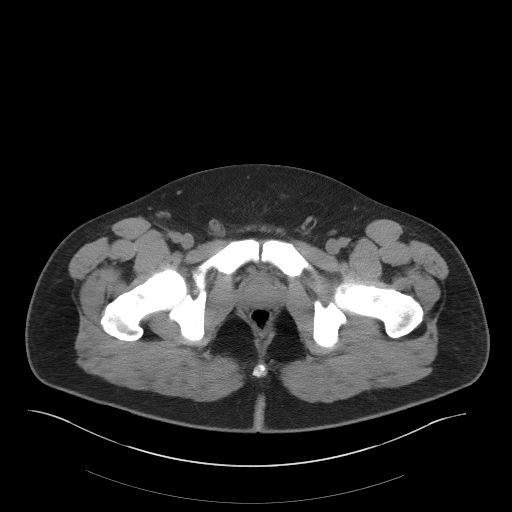
[im 22/101  soft-tissue]
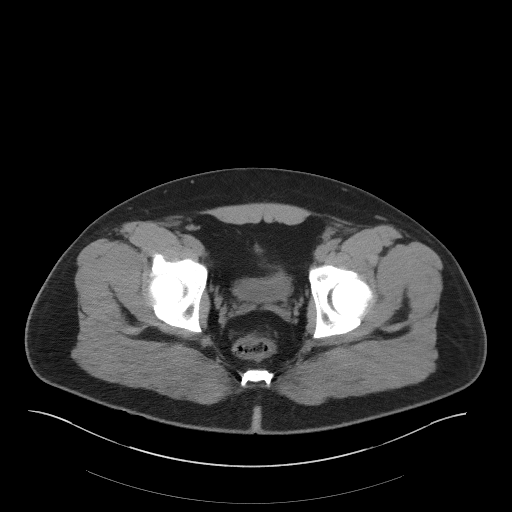
[im 27/101  soft-tissue]
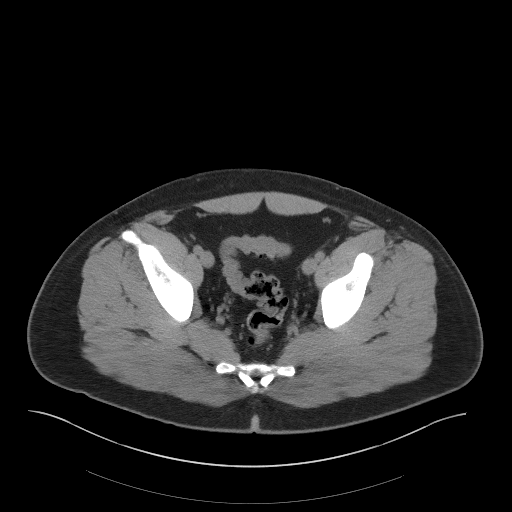
[im 37/101  soft-tissue]
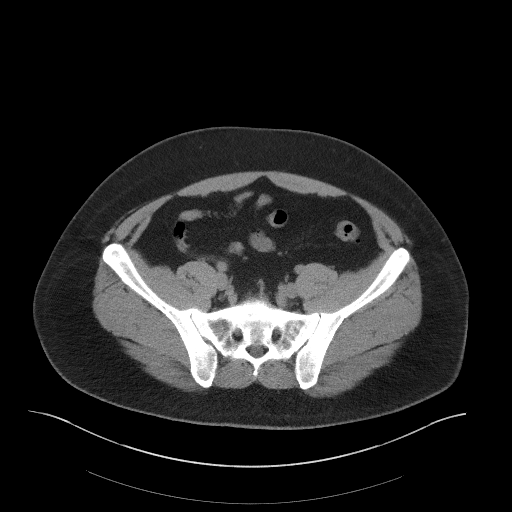
[im 43/101  soft-tissue]
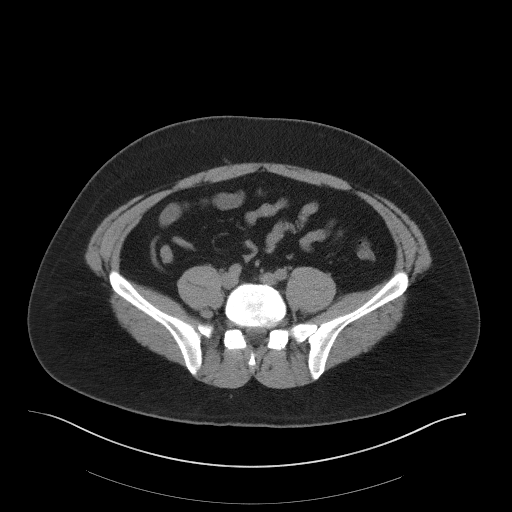
[im 53/101  soft-tissue]
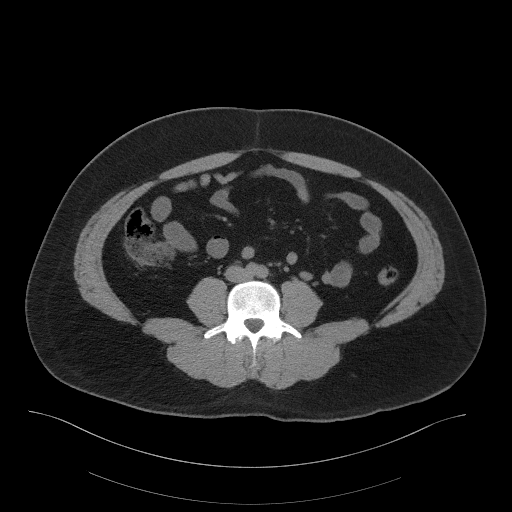
[im 58/101  soft-tissue]
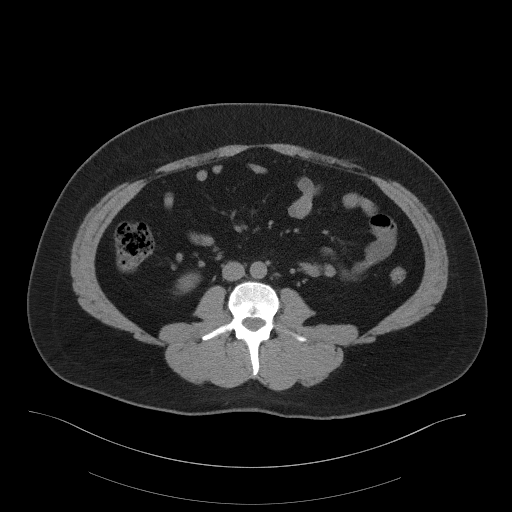
[im 64/101  soft-tissue]
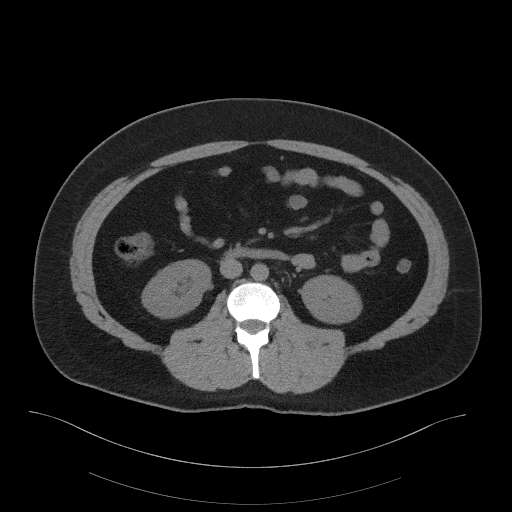
[im 64/101  bone]
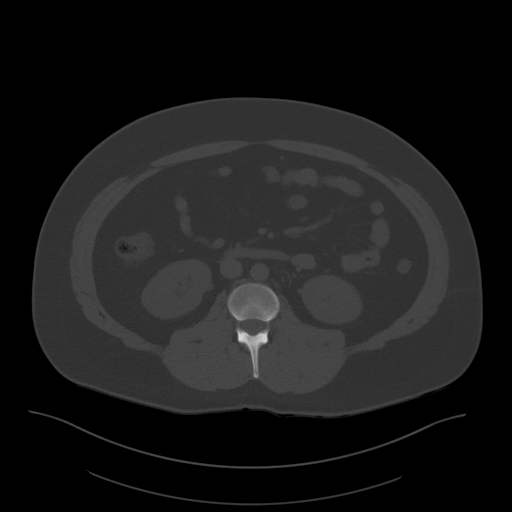
[im 74/101  soft-tissue]
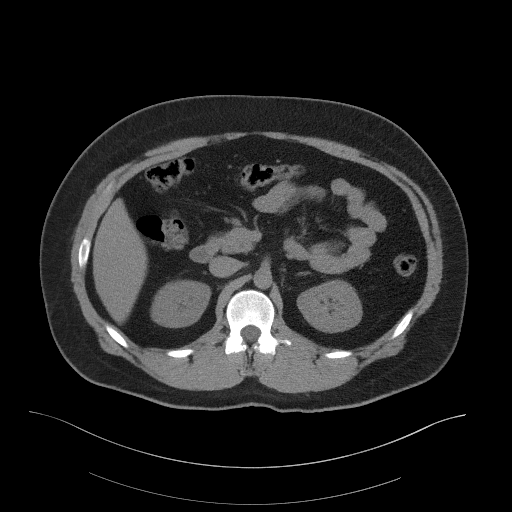
[im 79/101  soft-tissue]
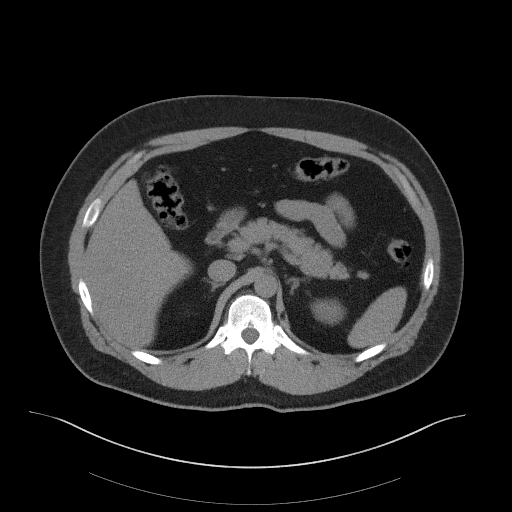
[im 85/101  soft-tissue]
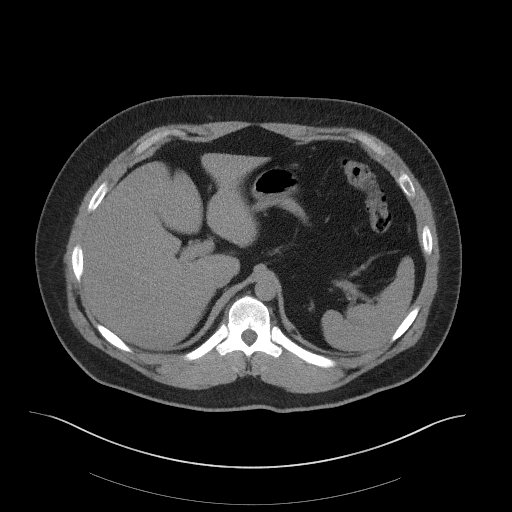
[im 95/101  soft-tissue]
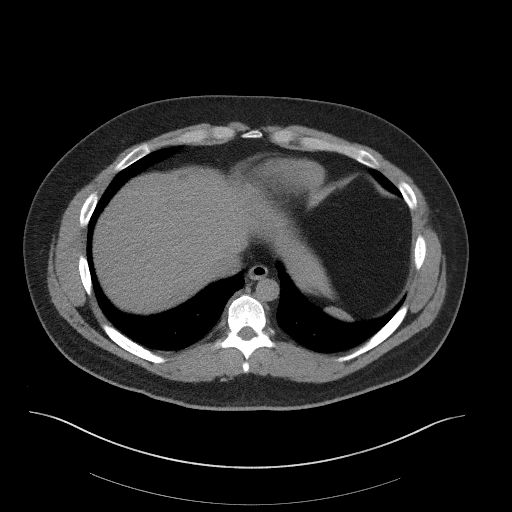

[Series 5: coronal st · coronal · 0.86mm/px · 3 of 111 slices shown]
[im 37/111  soft-tissue]
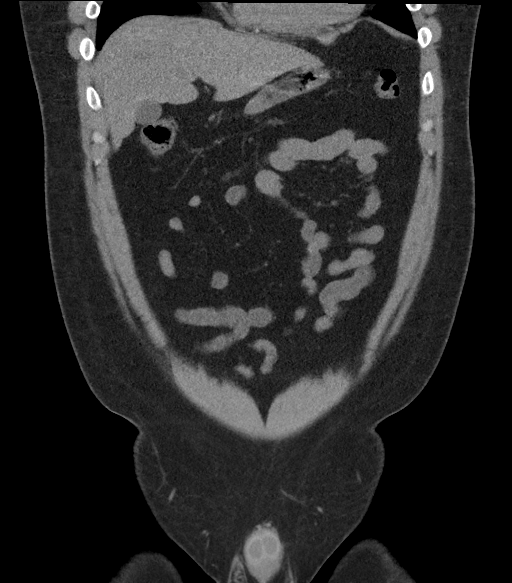
[im 49/111  soft-tissue]
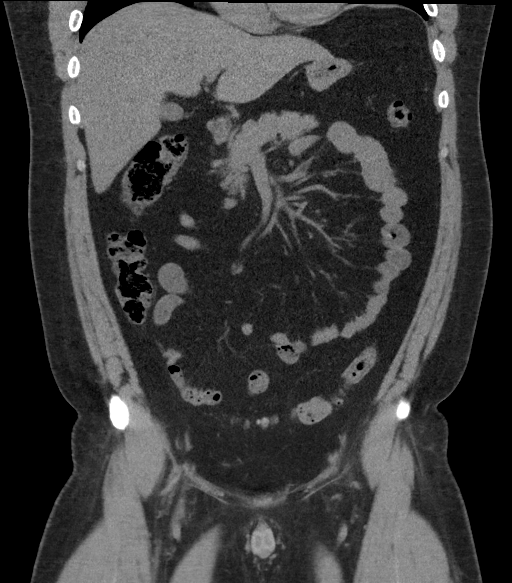
[im 62/111  soft-tissue]
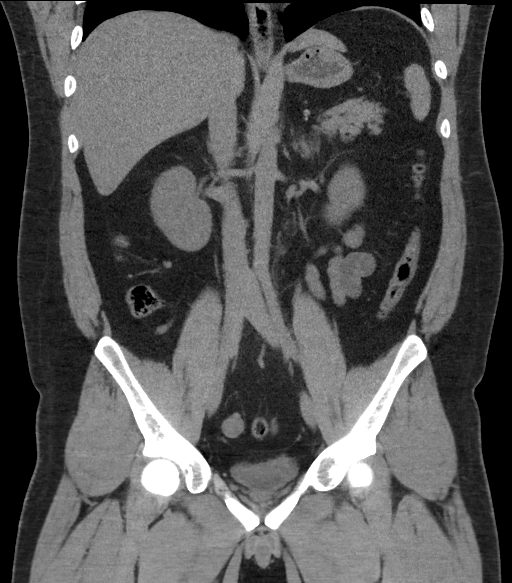

[16 of 46 positions shown; findings below may reference images not displayed]

FINDINGS: Lower chest: Unremarkable.

Hepatobiliary: The liver shows diffusely decreased attenuation
suggesting fat deposition. No focal abnormality in the liver on this
study without intravenous contrast. There is no evidence for
gallstones, gallbladder wall thickening, or pericholecystic fluid.

Pancreas: No focal mass lesion. No dilatation of the main duct. No
intraparenchymal cyst. No peripancreatic edema.

Spleen: No splenomegaly. No focal mass lesion.

Adrenals/Urinary Tract: No adrenal nodule or mass.

No right renal stones although there is mild fullness of the right
intrarenal collecting system. 1 x 1 x 2 mm stone is identified at
the right UPJ. No right ureteral stone.

1-2 mm nonobstructing stone noted interpolar left kidney, best seen
on coronal image 67/series 5. No left ureteral stone. No secondary
changes in the left kidney or ureter.

Bladder is decompressed without evidence of stone disease.

Stomach/Bowel: Stomach is unremarkable. No gastric wall thickening.
No evidence of outlet obstruction. Duodenum is normally positioned
as is the ligament of Treitz. Tiny duodenal diverticulum evident. No
small bowel wall thickening. No small bowel dilatation. The terminal
ileum is normal. The appendix is normal. No gross colonic mass. No
colonic wall thickening. Age advanced diverticular disease noted
sigmoid colon without diverticulitis.

Vascular/Lymphatic: No abdominal aortic aneurysm. No abdominal
aortic atherosclerotic calcification. There is no gastrohepatic or
hepatoduodenal ligament lymphadenopathy. No retroperitoneal or
mesenteric lymphadenopathy. No pelvic sidewall lymphadenopathy.

Reproductive: The prostate gland and seminal vesicles are
unremarkable.

Other: No intraperitoneal free fluid.

Musculoskeletal: No worrisome lytic or sclerotic osseous
abnormality.
IMPRESSION: 1. 1 x 1 x 2 mm right UPJ stone with mild fullness of the right
intrarenal collecting system. No other stones in the right renal
collecting system.
2. 1-2 mm nonobstructing stone interpolar left kidney.
3. Hepatic steatosis.

## 2024-02-10 ENCOUNTER — Ambulatory Visit: Admitting: Orthopaedic Surgery

## 2024-02-28 ENCOUNTER — Encounter: Payer: Self-pay | Admitting: Radiology
